# Patient Record
Sex: Male | Born: 1965 | Race: White | Hispanic: No | Marital: Single | Smoking: Current some day smoker
Health system: Southern US, Community
[De-identification: ages and names within clinical notes are randomized; demographics above are authoritative.]

## PROBLEM LIST (undated history)

## (undated) DIAGNOSIS — J3089 Other allergic rhinitis: Secondary | ICD-10-CM

---

## 2007-03-29 ENCOUNTER — Emergency Department: Payer: Self-pay | Admitting: Emergency Medicine

## 2007-03-30 ENCOUNTER — Emergency Department: Payer: Self-pay | Admitting: Unknown Physician Specialty

## 2007-04-20 ENCOUNTER — Ambulatory Visit: Payer: Self-pay | Admitting: Specialist

## 2008-01-20 ENCOUNTER — Ambulatory Visit: Payer: Self-pay | Admitting: Family Medicine

## 2010-01-04 ENCOUNTER — Ambulatory Visit: Payer: Self-pay | Admitting: Internal Medicine

## 2010-12-24 ENCOUNTER — Ambulatory Visit: Payer: Self-pay | Admitting: Internal Medicine

## 2017-10-07 ENCOUNTER — Ambulatory Visit
Admission: EM | Admit: 2017-10-07 | Discharge: 2017-10-07 | Disposition: A | Discharge status: Home / Self Care | Payer: Self-pay | Attending: Family Medicine | Admitting: Family Medicine

## 2017-10-07 ENCOUNTER — Ambulatory Visit
Admit: 2017-10-07 | Discharge: 2017-10-07 | Disposition: A | Discharge status: Home / Self Care | Payer: Self-pay | Source: Ambulatory Visit | Attending: Family Medicine | Admitting: Family Medicine

## 2017-10-07 ENCOUNTER — Ambulatory Visit (INDEPENDENT_AMBULATORY_CARE_PROVIDER_SITE_OTHER): Payer: Self-pay

## 2017-10-07 ENCOUNTER — Encounter: Payer: Self-pay | Admitting: *Deleted

## 2017-10-07 DIAGNOSIS — M7989 Other specified soft tissue disorders: Secondary | ICD-10-CM

## 2017-10-07 DIAGNOSIS — R2232 Localized swelling, mass and lump, left upper limb: Secondary | ICD-10-CM

## 2017-10-07 DIAGNOSIS — M79602 Pain in left arm: Secondary | ICD-10-CM | POA: Insufficient documentation

## 2017-10-07 LAB — COMPREHENSIVE METABOLIC PANEL
ALK PHOS: 94 U/L (ref 38–126)
ALT: 26 U/L (ref 17–63)
ANION GAP: 10 (ref 5–15)
AST: 33 U/L (ref 15–41)
Albumin: 4.5 g/dL (ref 3.5–5.0)
BILIRUBIN TOTAL: 0.8 mg/dL (ref 0.3–1.2)
BUN: 10 mg/dL (ref 6–20)
CALCIUM: 9.1 mg/dL (ref 8.9–10.3)
CO2: 24 mmol/L (ref 22–32)
Chloride: 100 mmol/L — ABNORMAL LOW (ref 101–111)
Creatinine, Ser: 0.94 mg/dL (ref 0.61–1.24)
GFR calc Af Amer: >60 mL/min (ref >60)
GLUCOSE: 152 mg/dL — AB (ref 65–99)
Potassium: 3.3 mmol/L — ABNORMAL LOW (ref 3.5–5.1)
Sodium: 134 mmol/L — ABNORMAL LOW (ref 135–145)
TOTAL PROTEIN: 8.2 g/dL — AB (ref 6.5–8.1)

## 2017-10-07 LAB — CBC WITH DIFFERENTIAL/PLATELET
BASOS ABS: 0.1 10*3/uL (ref 0–0.1)
BASOS PCT: 1 %
EOS ABS: 0.1 10*3/uL (ref 0–0.7)
EOS PCT: 1 %
HCT: 47.8 % (ref 40.0–52.0)
Hemoglobin: 16.7 g/dL (ref 13.0–18.0)
Lymphocytes Relative: 17 %
Lymphs Abs: 2.3 10*3/uL (ref 1.0–3.6)
MCH: 30.9 pg (ref 26.0–34.0)
MCHC: 35 g/dL (ref 32.0–36.0)
MCV: 88.5 fL (ref 80.0–100.0)
MONO ABS: 1.2 10*3/uL — AB (ref 0.2–1.0)
MONOS PCT: 9 %
Neutro Abs: 9.8 10*3/uL — ABNORMAL HIGH (ref 1.4–6.5)
Neutrophils Relative %: 72 %
PLATELETS: 459 10*3/uL — AB (ref 150–440)
RBC: 5.4 MIL/uL (ref 4.40–5.90)
RDW: 13.6 % (ref 11.5–14.5)
WBC: 13.5 10*3/uL — ABNORMAL HIGH (ref 3.8–10.6)

## 2017-10-07 LAB — LACTATE DEHYDROGENASE: LDH: 203 U/L — AB (ref 98–192)

## 2017-10-07 LAB — PROTIME-INR
INR: 0.97
PROTHROMBIN TIME: 12.8 s (ref 11.4–15.2)

## 2017-10-07 LAB — FIBRIN DERIVATIVES D-DIMER (ARMC ONLY): Fibrin derivatives D-dimer (ARMC): 441.72 ng/mL (FEU) (ref 0.00–499.00)

## 2017-10-07 MED ORDER — KETOROLAC TROMETHAMINE 60 MG/2ML IM SOLN
60.0000 mg | Freq: Once | INTRAMUSCULAR | Status: AC
  Administered 2017-10-07: 60 mg via INTRAMUSCULAR

## 2017-10-07 MED ORDER — POTASSIUM CHLORIDE CRYS ER 20 MEQ PO TBCR: 20.0000 meq | EXTENDED_RELEASE_TABLET | Freq: Two times a day (BID) | ORAL | 0 refills | Status: DC

## 2017-10-07 MED ORDER — TRAMADOL HCL 50 MG PO TABS: 50.0000 mg | ORAL_TABLET | Freq: Three times a day (TID) | ORAL | 0 refills | Status: DC | PRN

## 2017-10-07 NOTE — Discharge Instructions (Signed)
Elevation, rest.  We will call with the results.  Take care  Dr. Adriana Simasook

## 2017-10-07 NOTE — ED Triage Notes (Signed)
Pt has had intermittent left arm pain for several weeks. Yesterday pt awoke with left arm pain and edema which worsened throughout day. This am left arm pain persists with discoloration to entire arm. Pt denies injury.

## 2017-10-07 NOTE — ED Provider Notes (Signed)
[facility]    CSN: [id number] Arrival date & time: 10/07/17  4098  History   Chief Complaint Chief Complaint  Patient presents with  . Extremity Pain   HPI  52 year old male presents with the above complaint.  Patient reports a 2 month history of pain in his left arm (particularly around the elbow).  Yesterday, he awoke early in the morning and noted swelling of his entire left arm. Associated pain, 8/10 in severity. Patient continues to have pain and swelling. This am, he awoke and noted significant bruising of his arm. No reports of fall, trauma, or injury. Worse with activity. No relieving factors. No other reported symptoms. No other complaints at this time.  PMH - No medical problems per patient   Surgical Hx - No past surgeries.  Home Medications    Prior to Admission medications   Medication Sig Start Date End Date Taking? Authorizing Provider  potassium chloride SA (K-DUR,KLOR-CON) 20 MEQ tablet Take 1 tablet (20 mEq total) by mouth 2 (two) times daily. 10/07/17   Tommie Sams, DO  traMADol (ULTRAM) 50 MG tablet Take 1 tablet (50 mg total) by mouth every 8 (eight) hours as needed. 10/07/17   Tommie Sams, DO   Family History Family History  Problem Relation Age of Onset  . Cancer Mother   . CAD Father     Social History Social History   Tobacco Use  . Smoking status: Current Some Day Smoker  . Smokeless tobacco: Never Used  Substance Use Topics  . Alcohol use: Yes  . Drug use: No     Allergies   Patient has no known allergies.   Review of Systems Review of Systems  Constitutional: Positive for fatigue.  Respiratory: Negative.   Cardiovascular: Negative.   Musculoskeletal:       Left arm pain   Physical Exam Triage Vital Signs ED Triage Vitals  Enc Vitals Group     BP 10/07/17 0849 139/89     Pulse Rate 10/07/17 0849 92     Resp 10/07/17 0849 16     Temp 10/07/17 0849 98.5 F (36.9 C)     Temp Source 10/07/17 0849 Oral     SpO2  10/07/17 0849 99 %     Weight 10/07/17 0853 216 lb (98 kg)     Height 10/07/17 0853 5\' 6"  (1.676 m)     Head Circumference --      Peak Flow --      Pain Score 10/07/17 0851 8     Pain Loc --      Pain Edu? --      Excl. in GC? --    Updated Vital Signs BP 139/89 (BP Location: Right Arm)   Pulse 92   Temp 98.5 F (36.9 C) (Oral)   Resp 16   Ht 5\' 6"  (1.676 m)   Wt 216 lb (98 kg)   SpO2 99%   BMI 34.86 kg/m     Physical Exam  Constitutional: He is oriented to person, place, and time. He appears well-developed and well-nourished. No distress.  HENT:  Head: Normocephalic and atraumatic.  Eyes: Conjunctivae are normal.  Cardiovascular: Normal rate and regular rhythm.  2/6 systolic murmur.   Pulmonary/Chest: Effort normal and breath sounds normal. No respiratory distress.  Musculoskeletal:  Left arm -diffuse swelling of his entire left upper extremity.  Significant bruising noted of the proximal arm extending distally.  Predominant around the biceps and tricep.  2+  radial pulse.  Significant tenderness to palpation of the upper extremity particularly the bicep region.  Neurological: He is alert and oriented to person, place, and time.  Psychiatric: He has a normal mood and affect. His behavior is normal.  Nursing note and vitals reviewed.  UC Treatments / Results  Labs (all labs ordered are listed, but only abnormal results are displayed) Labs Reviewed  CBC WITH DIFFERENTIAL/PLATELET - Abnormal; Notable for the following components:      Result Value   WBC 13.5 (*)    Platelets 459 (*)    Neutro Abs 9.8 (*)    Monocytes Absolute 1.2 (*)    All other components within normal limits  LACTATE DEHYDROGENASE - Abnormal; Notable for the following components:   LDH 203 (*)    All other components within normal limits  COMPREHENSIVE METABOLIC PANEL - Abnormal; Notable for the following components:   Sodium 134 (*)    Potassium 3.3 (*)    Chloride 100 (*)    Glucose, Bld 152  (*)    Total Protein 8.2 (*)    All other components within normal limits  PROTIME-INR  FIBRIN DERIVATIVES D-DIMER ([facility] ONLY)  PATHOLOGIST SMEAR REVIEW    EKG  EKG Interpretation None       Radiology Dg Chest 2 View  Result Date: 10/07/2017 CLINICAL DATA:  Left upper extremity swelling EXAM: CHEST  2 VIEW COMPARISON:  03/30/2007 FINDINGS: The heart size and mediastinal contours are within normal limits. Both lungs are clear. The visualized skeletal structures are unremarkable. IMPRESSION: No active cardiopulmonary disease. Electronically Signed   By: Alcide Clever M.D.   On: 10/07/2017 10:43   Dg Elbow Complete Left  Result Date: 10/07/2017 CLINICAL DATA:  Left arm pain and swelling EXAM: LEFT ELBOW - COMPLETE 3+ VIEW COMPARISON:  None. FINDINGS: Considerable soft tissue edema is noted. No acute fracture or dislocation is noted. No joint effusion is seen. No other focal abnormality is noted. IMPRESSION: Considerable soft tissue swelling without acute bony abnormality. Electronically Signed   By: Alcide Clever M.D.   On: 10/07/2017 10:50   Dg Shoulder Left  Result Date: 10/07/2017 CLINICAL DATA:  Left arm pain and swelling EXAM: LEFT SHOULDER - 2+ VIEW COMPARISON:  None FINDINGS: There is no evidence of fracture or dislocation. There is no evidence of arthropathy or other focal bone abnormality. Soft tissue calcifications are noted of uncertain significance. IMPRESSION: No acute bony abnormality is noted. Mild soft tissue calcifications are seen. Electronically Signed   By: Alcide Clever M.D.   On: 10/07/2017 10:44   Dg Humerus Left  Result Date: 10/07/2017 CLINICAL DATA:  Left shoulder pain EXAM: LEFT HUMERUS - 2+ VIEW COMPARISON:  None. FINDINGS: No acute fracture or dislocation is noted. Scattered soft tissue calcifications are noted in the deltoid muscle. IMPRESSION: No acute abnormality noted. Electronically Signed   By: Alcide Clever M.D.   On: 10/07/2017 10:49     Procedures Procedures (including critical care time)  Medications Ordered in UC Medications  ketorolac (TORADOL) injection 60 mg (60 mg Intramuscular Given 10/07/17 1052)     Initial Impression / Assessment and Plan / UC Course  I have reviewed the triage vital signs and the nursing notes.  Pertinent labs & imaging results that were available during my care of the patient were reviewed by me and considered in my medical decision making (see chart for details).    52 year old male presents with left upper extremity swelling and bruising.  Etiology  prognosis remains uncertain.  He is going for ultrasound today.  I discussed the case with my colleague Dr. Katrinka BlazingSmith.  He recommended additional workup.  Chest x-ray negative.  X-rays of the upper extremity negative.  CBC with leukocytosis with left shift.  Obtaining peripheral smear.  Mild LDH elevation.  Patient given Toradol for pain.  Awaiting venous Doppler to determine further course/workup.  Mild hyperkalemia noted.  Repleting. Tramadol for pain.  Final Clinical Impressions(s) / UC Diagnoses   Final diagnoses:  Left arm swelling    ED Discharge Orders        Ordered    US Venous Img Upper Uni Left    Comments:  Specimen [id number]: Pt can leave    10/07/17 1106    potassium chloride SA (K-DUR,KLOR-CON) 20 MEQ tablet  2 times daily     10/07/17 1122    traMADol (ULTRAM) 50 MG tablet  Every 8 hours PRN     10/07/17 1122     Controlled Substance Prescriptions [place] Controlled Substance Registry consulted? Not Applicable   Tommie SamsCook, Runette Scifres G, DO 10/07/17 1122

## 2017-10-10 LAB — PATHOLOGIST SMEAR REVIEW

## 2017-11-16 ENCOUNTER — Ambulatory Visit
Admission: EM | Admit: 2017-11-16 | Discharge: 2017-11-16 | Disposition: A | Discharge status: Home / Self Care | Payer: Self-pay

## 2017-11-16 ENCOUNTER — Other Ambulatory Visit: Payer: Self-pay

## 2017-11-16 DIAGNOSIS — R05 Cough: Secondary | ICD-10-CM

## 2017-11-16 DIAGNOSIS — J111 Influenza due to unidentified influenza virus with other respiratory manifestations: Secondary | ICD-10-CM

## 2017-11-16 DIAGNOSIS — R0981 Nasal congestion: Secondary | ICD-10-CM

## 2017-11-16 DIAGNOSIS — J029 Acute pharyngitis, unspecified: Secondary | ICD-10-CM

## 2017-11-16 DIAGNOSIS — R509 Fever, unspecified: Secondary | ICD-10-CM

## 2017-11-16 DIAGNOSIS — R69 Illness, unspecified: Principal | ICD-10-CM

## 2017-11-16 DIAGNOSIS — M791 Myalgia, unspecified site: Secondary | ICD-10-CM

## 2017-11-16 HISTORY — DX: Other allergic rhinitis: J30.89

## 2017-11-16 MED ORDER — OSELTAMIVIR PHOSPHATE 75 MG PO CAPS: 75.0000 mg | ORAL_CAPSULE | Freq: Two times a day (BID) | ORAL | 0 refills | Status: AC

## 2017-11-16 NOTE — ED Provider Notes (Signed)
[facility] ____________________________________________  Time seen: Approximately 8:36 AM  I have reviewed the triage vital signs and the nursing notes.   HISTORY  Chief Complaint Generalized Body Aches   HPI Tony Santiago is a 52 y.o. male presenting for evaluation of 2 days of runny nose, nasal congestion, chills, body aches and subjective fever.  Patient reports symptoms have continued and today has felt even more achy.  Also states today he had some crusting to bilateral eyes.  States he does have a history of seasonal allergies but does not typically feel the same way.  Has taken some over-the-counter decongestants and cough medications, no resolution, slight improvement.  Has continued to overall eat and drink well.  States no home sick contacts, but was recently around a lot of kids at his nieces basketball game.  Denies other aggravating or alleviating factors.  States sore throat is described as a scratchy throat, declines strep evaluation states does not feel like previous strep throat. Denies chest pain, shortness of breath, abdominal pain,  or rash. Denies recent sickness. Denies recent antibiotic use.     Past Medical History:  Diagnosis Date  . Environmental and seasonal allergies     There are no active problems to display for this patient.   History reviewed. No pertinent surgical history.   No current facility-administered medications for this encounter.   Current Outpatient Medications:  .  levocetirizine (XYZAL) 5 MG tablet, Take 5 mg by mouth every evening., Disp: , Rfl:  .  oseltamivir (TAMIFLU) 75 MG capsule, Take 1 capsule (75 mg total) by mouth every 12 (twelve) hours., Disp: 10 capsule, Rfl: 0  Allergies Patient has no known allergies.  Family History  Problem Relation Age of Onset  . Cancer Mother   . CAD Father     Social History Social History   Tobacco Use  . Smoking status: Current Some Day Smoker    Types: Cigarettes  .  Smokeless tobacco: Never Used  Substance Use Topics  . Alcohol use: Yes    Comment: social  . Drug use: No    Review of Systems Constitutional: As above. Eyes: No visual changes. ENT: As above.  Cardiovascular: Denies chest pain. Respiratory: Denies shortness of breath. Gastrointestinal: No abdominal pain.  Musculoskeletal: Negative for back pain. Skin: Negative for rash.   ____________________________________________   PHYSICAL EXAM:  VITAL SIGNS: ED Triage Vitals  Enc Vitals Group     BP 11/16/17 0820 125/83     Pulse Rate 11/16/17 0816 77     Resp 11/16/17 0816 18     Temp 11/16/17 0816 98 F (36.7 C)     Temp Source 11/16/17 0816 Oral     SpO2 11/16/17 0816 97 %     Weight 11/16/17 0818 215 lb (97.5 kg)     Height 11/16/17 0818 5\' 6"  (1.676 m)     Head Circumference --      Peak Flow --      Pain Score 11/16/17 0818 7     Pain Loc --      Pain Edu? --      Excl. in GC? --     Constitutional: Alert and oriented. Well appearing and in no acute distress. Eyes: Conjunctivae are normal.  Head: Atraumatic. No sinus tenderness to palpation. No swelling. No erythema.  Ears: no erythema, normal TMs bilaterally.   Nose:Nasal congestion with clear rhinorrhea  Mouth/Throat: Mucous membranes are moist. Mild pharyngeal erythema. No tonsillar swelling or exudate.  Neck: No stridor.  No cervical spine tenderness to palpation. Hematological/Lymphatic/Immunilogical: No cervical lymphadenopathy. Cardiovascular: Normal rate, regular rhythm. Grossly normal heart sounds.  Good peripheral circulation. Respiratory: Normal respiratory effort.  No retractions. No wheezes, rales or rhonchi. Good air movement.  Musculoskeletal: Ambulatory with steady gait. No cervical, thoracic or lumbar tenderness to palpation. Neurologic:  Normal speech and language. No gait instability. Skin:  Skin appears warm, dry and intact. No rash noted. Psychiatric: Mood and affect are normal. Speech and  behavior are normal.  ___________________________________________   LABS (all labs ordered are listed, but only abnormal results are displayed)  Labs Reviewed - No data to display ____________________________________________   PROCEDURES Procedures    INITIAL IMPRESSION / ASSESSMENT AND PLAN / ED COURSE  Pertinent labs & imaging results that were available during my care of the patient were reviewed by me and considered in my medical decision making (see chart for details).  Well-appearing patient.  No acute distress.  Suspect influenza-like illness.  Declines strep evaluation.  Discussed the use of Tamiflu, Rx given.  Encourage rest, fluids, supportive care.  Declines need for cough medication.  Work note given for today and tomorrow.Discussed indication, risks and benefits of medications with patient.  Discussed follow up with Primary care physician this week. Discussed follow up and return parameters including no resolution or any worsening concerns. Patient verbalized understanding and agreed to plan.   ____________________________________________   FINAL CLINICAL IMPRESSION(S) / ED DIAGNOSES  Final diagnoses:  Influenza-like illness     ED Discharge Orders        Ordered    oseltamivir (TAMIFLU) 75 MG capsule  Every 12 hours     11/16/17 0831       Note: This dictation was prepared with Dragon dictation along with smaller phrase technology. Any transcriptional errors that result from this process are unintentional.         Renford Dills, NP 11/16/17 [phone]

## 2017-11-16 NOTE — Discharge Instructions (Signed)
Take medication as prescribed. Rest. Drink plenty of fluids.  ° °Follow up with your primary care physician this week as needed. Return to Urgent care for new or worsening concerns.  ° °

## 2017-11-16 NOTE — ED Triage Notes (Signed)
Pt with sx starting on Monday but worse this a.m. Generalized body aches, matter in eyes this a.m., sore throat, feeling fatigued and has had a cough.

## 2018-05-26 IMAGING — US US EXTREM  UP VENOUS*L*
1 series · 13 of 24 positions shown · non-contrast
Comparison: Left shoulder, humerus and elbow radiographs - earlier
same day

CLINICAL DATA: Left upper extremity pain and edema for the past
day. History of smoking. Evaluate for DVT.



[Series 1: us extrem up venous*left* · 0.09mm/px · 13 of 36 slices shown]
[im 1/36]
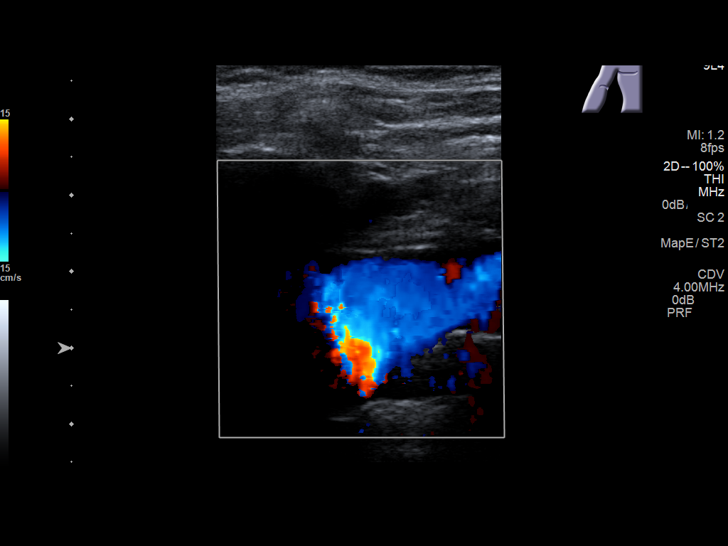
[im 4/36]
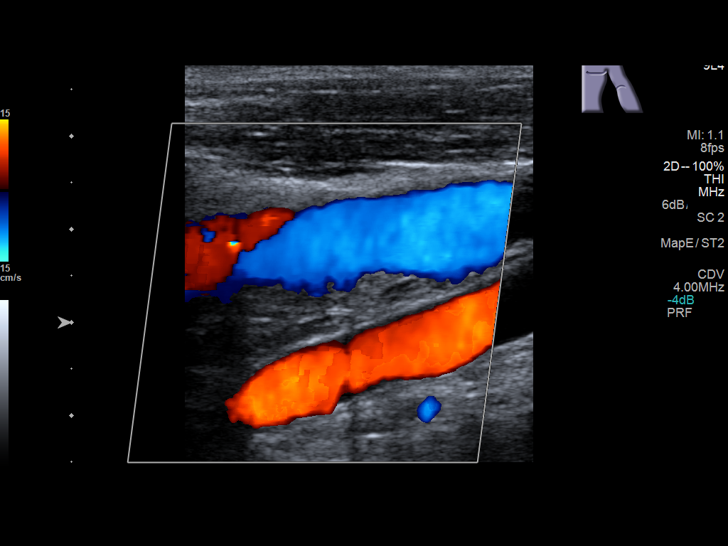
[im 7/36]
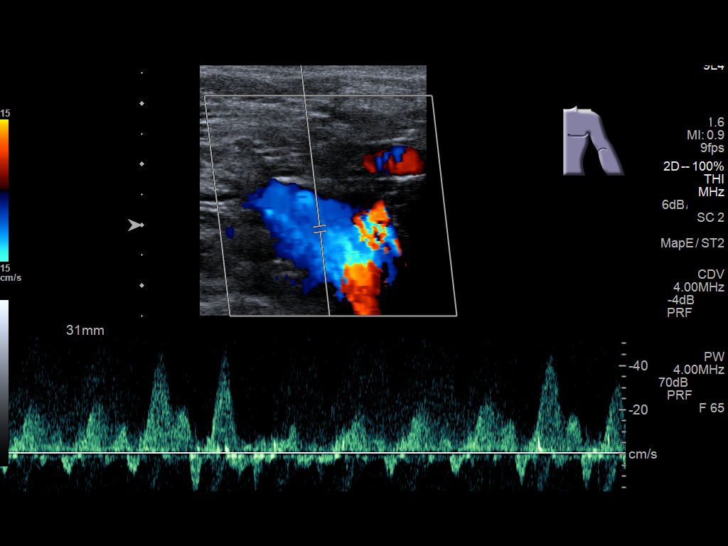
[im 10/36]
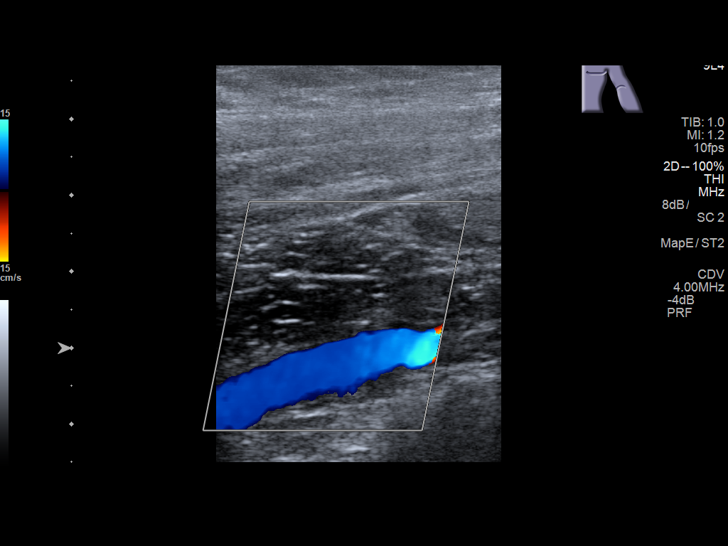
[im 13/36]
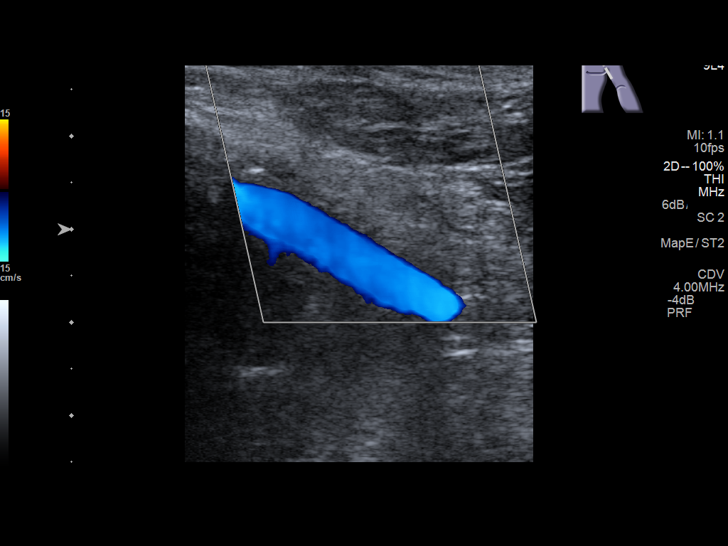
[im 16/36]
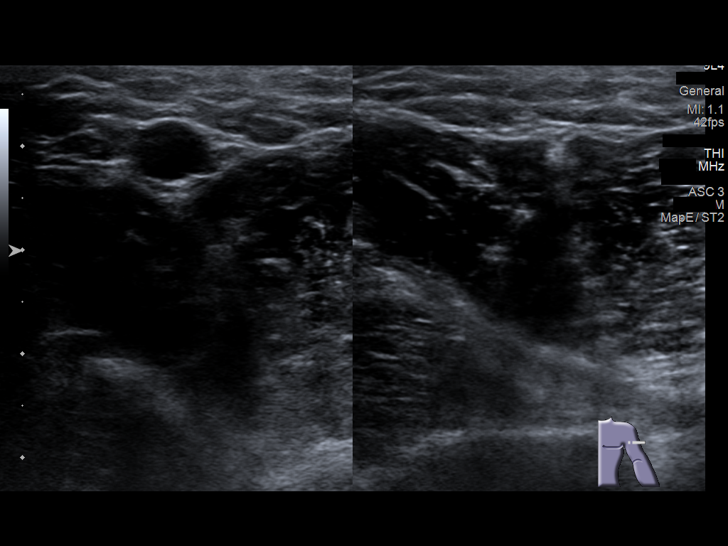
[im 19/36]
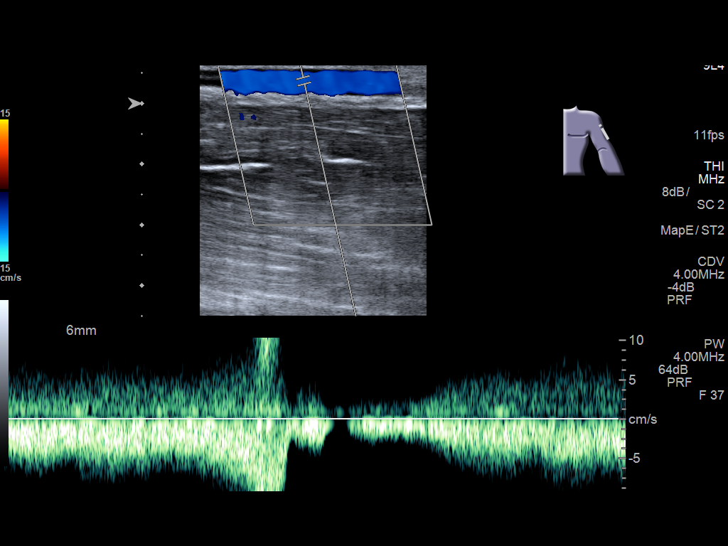
[im 20/36]
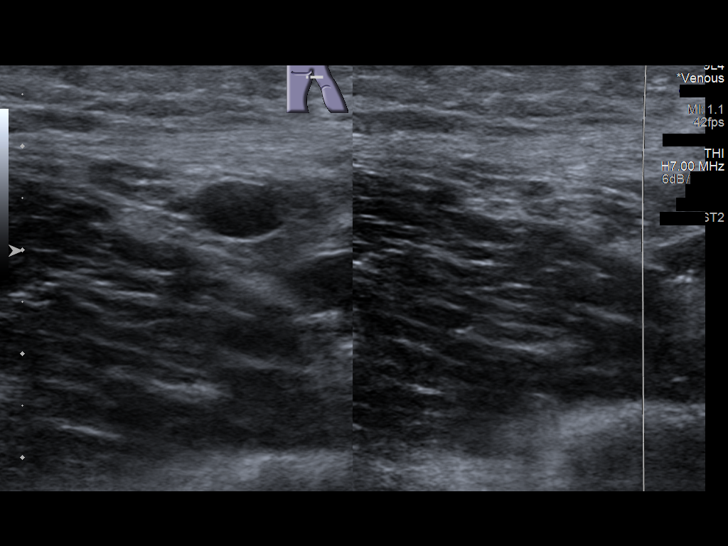
[im 23/36]
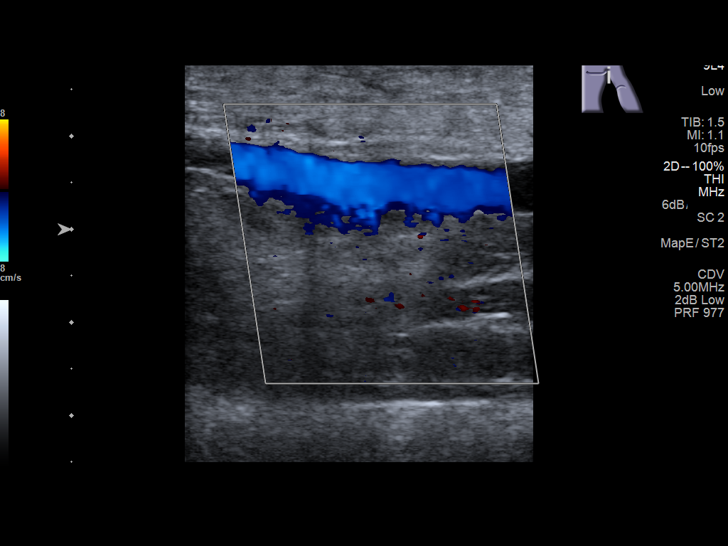
[im 26/36]
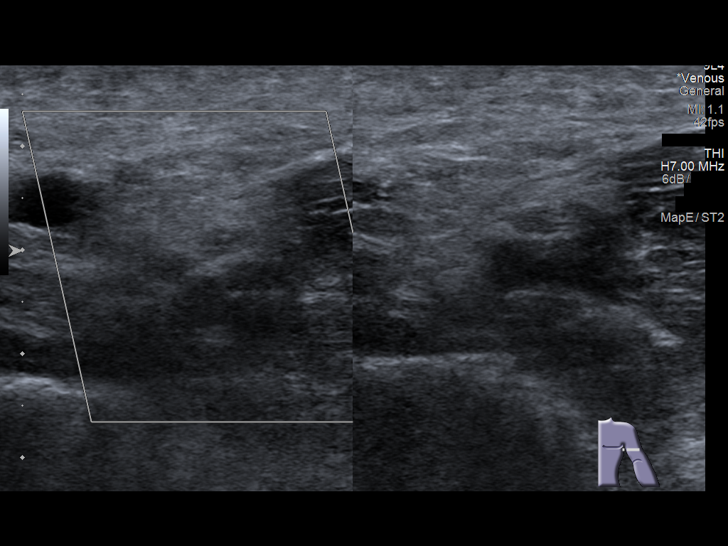
[im 29/36]
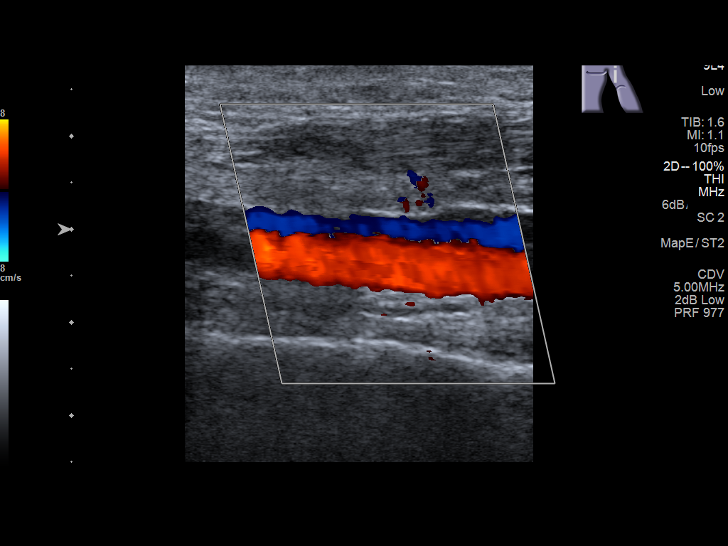
[im 32/36]
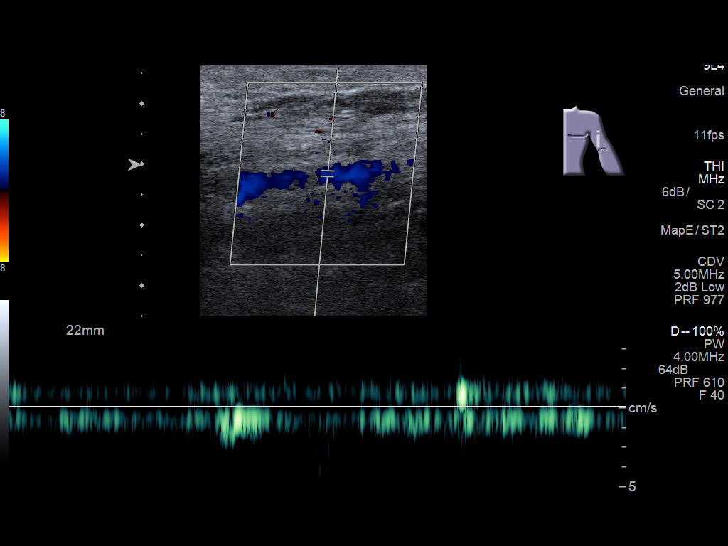
[im 36/36]
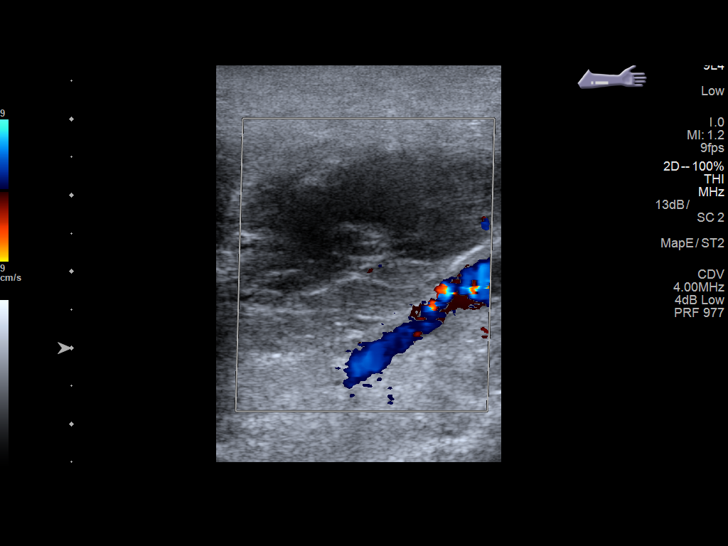

[13 of 24 positions shown; findings below may reference images not displayed]

FINDINGS: Contralateral Subclavian Vein: Respiratory phasicity is normal and
symmetric with the symptomatic side. No evidence of thrombus. Normal
compressibility.

Internal Jugular Vein: No evidence of thrombus. Normal
compressibility, respiratory phasicity and response to augmentation.

Subclavian Vein: No evidence of thrombus. Normal compressibility,
respiratory phasicity and response to augmentation.

Axillary Vein: No evidence of thrombus. Normal compressibility,
respiratory phasicity and response to augmentation.

Cephalic Vein: No evidence of thrombus. Normal compressibility,
respiratory phasicity and response to augmentation.

Basilic Vein: No evidence of thrombus. Normal compressibility,
respiratory phasicity and response to augmentation.

Brachial Veins: No evidence of thrombus. Normal compressibility,
respiratory phasicity and response to augmentation.

Radial Veins: No evidence of thrombus. Normal compressibility,
respiratory phasicity and response to augmentation.

Ulnar Veins: No evidence of thrombus. Normal compressibility,
respiratory phasicity and response to augmentation.

Venous Reflux:  None visualized.

Other Findings:  None visualized.
IMPRESSION: No evidence of DVT within the left upper extremity.

## 2018-10-16 IMAGING — CR DG HUMERUS 2V *L*
2 series · 2 of 2 positions shown · non-contrast
Comparison: None.

CLINICAL DATA: Left shoulder pain

EXAM:
LEFT HUMERUS - 2+ VIEW

[humerus ap]
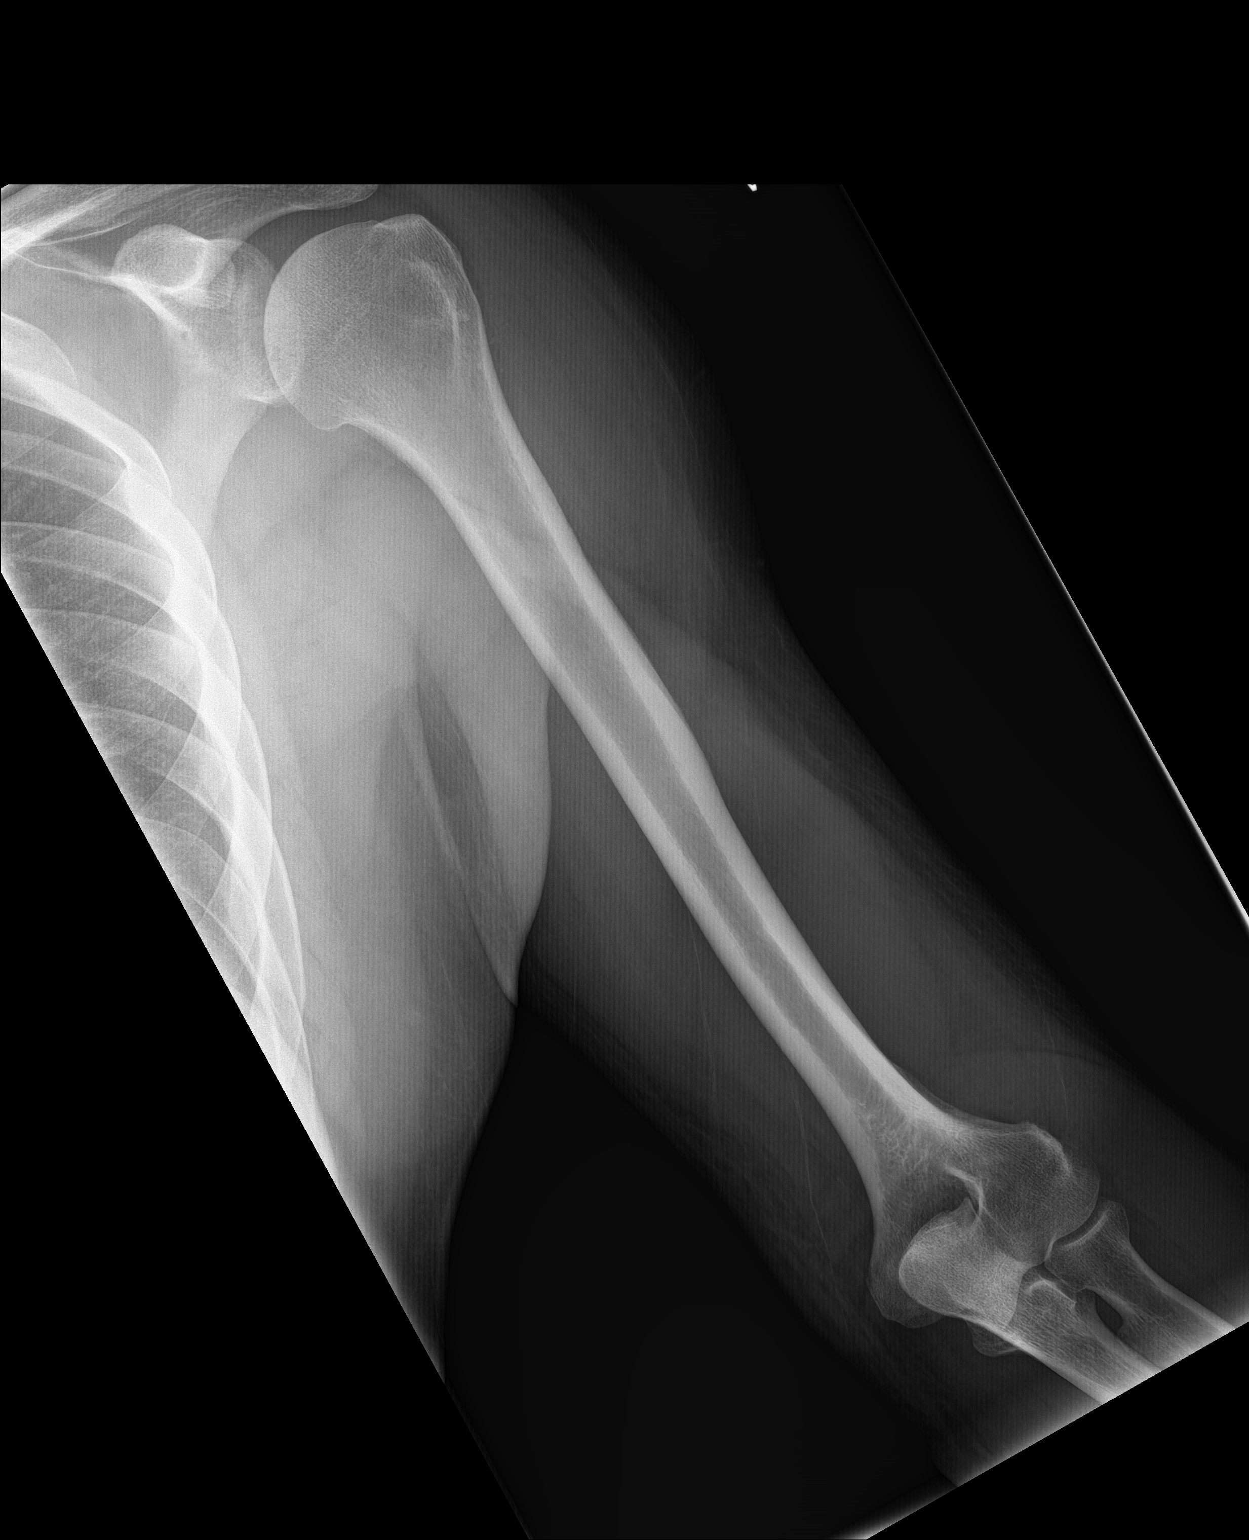

[humerus lat]
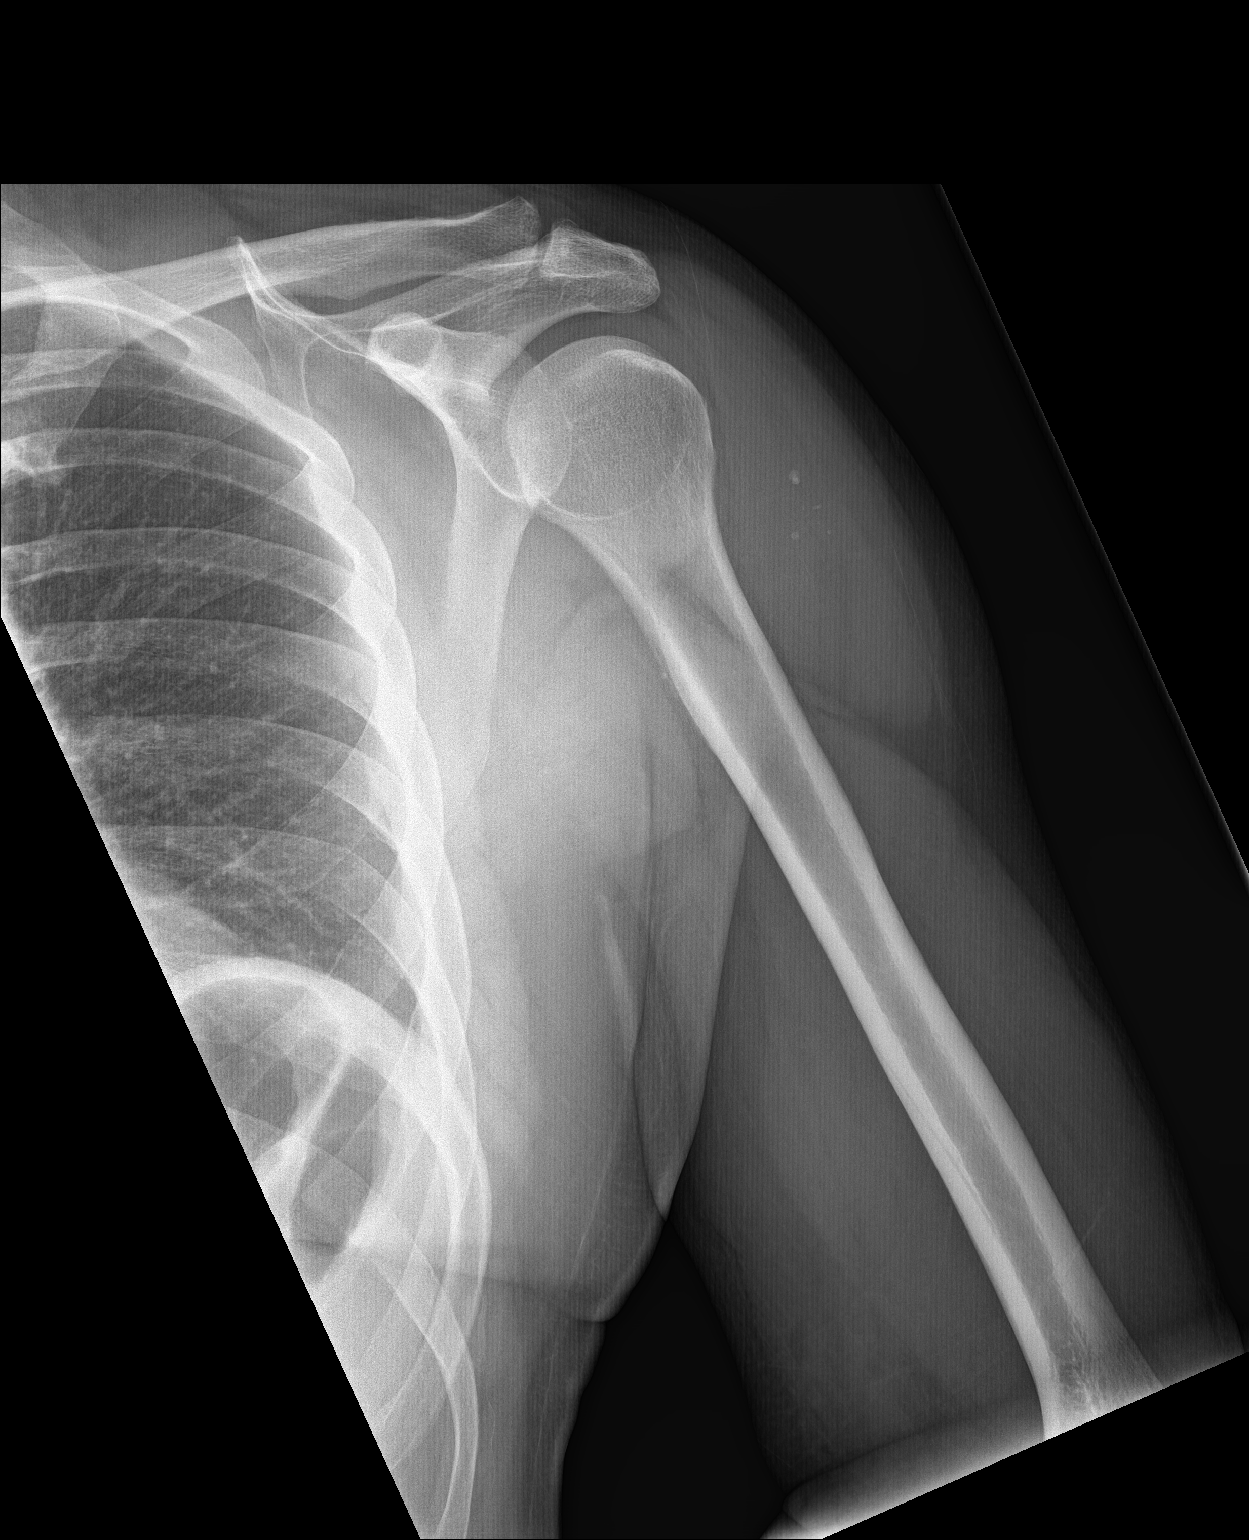

[2 of 2 positions shown; findings below may reference images not displayed]

FINDINGS: No acute fracture or dislocation is noted. Scattered soft tissue
calcifications are noted in the deltoid muscle.
IMPRESSION: No acute abnormality noted.

## 2021-10-07 DEATH — deceased
